# Patient Record
Sex: Female | Born: 1940 | Race: White | Hispanic: No | Marital: Single
Health system: Southern US, Community
[De-identification: ages and names within clinical notes are randomized; demographics above are authoritative.]

---

## 2007-06-06 ENCOUNTER — Observation Stay (HOSPITAL_COMMUNITY): Admission: EM | Admit: 2007-06-06 | Discharge: 2007-06-07 | Payer: Self-pay | Admitting: Emergency Medicine
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
## 2009-10-03 ENCOUNTER — Emergency Department (HOSPITAL_COMMUNITY): Admission: EM | Admit: 2009-10-03 | Discharge: 2009-10-03 | Payer: Self-pay | Admitting: Emergency Medicine

## 2009-11-01 DEATH — deceased
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
## 2011-07-13 LAB — BASIC METABOLIC PANEL
GFR calc Af Amer: >60
GFR calc non Af Amer: >60
Potassium: 4.3
Sodium: 142

## 2011-07-13 LAB — URINALYSIS, ROUTINE W REFLEX MICROSCOPIC
Glucose, UA: NEGATIVE
Hgb urine dipstick: NEGATIVE
Ketones, ur: NEGATIVE
Protein, ur: NEGATIVE

## 2011-07-13 LAB — CBC
HCT: 38.3
HCT: 39.7
Hemoglobin: 13.6
MCV: 83.3
Platelets: 211
Platelets: 233
RBC: 4.64
RDW: 13.7
WBC: 9.3

## 2011-07-13 LAB — POCT I-STAT CREATININE
Creatinine, Ser: 0.8
Operator id: 270921

## 2011-07-13 LAB — AMYLASE: Amylase: 62

## 2011-07-13 LAB — I-STAT 8, (EC8 V) (CONVERTED LAB)
Acid-Base Excess: 2
HCT: 40
Operator id: 270921
Potassium: 5.1
TCO2: 25
pCO2, Ven: 29.5 — ABNORMAL LOW
pH, Ven: 7.517 — ABNORMAL HIGH

## 2011-07-13 LAB — URINE MICROSCOPIC-ADD ON

## 2011-07-13 LAB — PROTIME-INR: Prothrombin Time: 13.3

## 2016-02-14 ENCOUNTER — Telehealth: Payer: Self-pay | Admitting: Internal Medicine

## 2016-02-14 NOTE — Telephone Encounter (Signed)
Did not need this encounter °
